# Patient Record
Sex: Female | Born: 1955 | Race: White | Hispanic: No
Health system: Southern US, Community
[De-identification: ages and names within clinical notes are randomized; demographics above are authoritative.]

---

## 2009-09-12 ENCOUNTER — Encounter: Admission: RE | Admit: 2009-09-12 | Discharge: 2009-09-12 | Payer: Self-pay | Admitting: Unknown Physician Specialty

## 2015-05-03 ENCOUNTER — Ambulatory Visit (INDEPENDENT_AMBULATORY_CARE_PROVIDER_SITE_OTHER): Payer: BLUE CROSS/BLUE SHIELD | Admitting: Sports Medicine

## 2015-05-03 ENCOUNTER — Ambulatory Visit (INDEPENDENT_AMBULATORY_CARE_PROVIDER_SITE_OTHER): Payer: BLUE CROSS/BLUE SHIELD

## 2015-05-03 VITALS — BP 153/81 | HR 82 | Resp 18 | Wt 171.0 lb

## 2015-05-03 DIAGNOSIS — M25571 Pain in right ankle and joints of right foot: Secondary | ICD-10-CM

## 2015-05-03 MED ORDER — MELOXICAM 15 MG PO TABS: ORAL_TABLET | ORAL | Status: AC

## 2015-05-03 NOTE — Progress Notes (Signed)
   Subjective:    I'm seeing this patient as a consultation for:  Dr. Harl Bowie  CC: Right ankle pain  HPI: Patient presents with 4 months of right ankle pain that began after she fell out of a golf cart. She says that the ankle initially became swollen and bruised but "not too bad" on either account. She rested it at first and the pain/swelling subsided after a few days. She reports that since walking on it again she has had pain that it is worse with going up and down stairs or with any kind of exaggerated ankle motion. She cannot point to a particular motion that brings the pain on, more so any ankle movement. She has tried some ibuprofen and an OTC ankle brace with limited relief. Pain is described as more annoying than debilitating.  The pain does not radiate and is not associated with any weakness, numbness, or tingling. She has not noticed any fevers, erythema, or swelling.  Past medical history, Surgical history, Family history not pertinant except as noted below, Social history, Allergies, and medications have been entered into the medical record, reviewed, and no changes needed.   Review of Systems: No headache, visual changes, nausea, vomiting, diarrhea, constipation, dizziness, abdominal pain, skin rash, fevers, chills, night sweats, weight loss, swollen lymph nodes, body aches, joint swelling, muscle aches, chest pain, shortness of breath, mood changes, visual or auditory hallucinations.   Objective:   General: Well Developed, well nourished, and in no acute distress.  Neuro/Psych: Alert and oriented x3, extra-ocular muscles intact, able to move all 4 extremities, sensation grossly intact. Skin: Warm and dry, no rashes noted.  Respiratory: Not using accessory muscles, speaking in full sentences, trachea midline.  Cardiovascular: Pulses palpable, no extremity edema. Abdomen: Does not appear distended. Right Ankle: Minor swelling noted just anterior and inferior to the lateral  malleolus with no visible erythema. Range of motion is full in all directions. Strength is 5/5 in all directions. Resisted eversion and dorsiflexion bring on pain. Negative anterior drawer sign with stable lateral and medial ligaments; squeeze test, flexion and inversion, and kleiger test unremarkable; Talar dome and ATFL region tender; No pain at base of 5th MT; No tenderness over cuboid; No tenderness over N spot or navicular prominence No tenderness on posterior aspects of lateral and medial malleolus No sign of peroneal tendon subluxations or tenderness to palpation Negative tarsal tunnel tinel's Able to walk 4 steps.  Impression and Recommendations:   This case required medical decision making of moderate complexity.  Location of patient's pain and mechanism of injury is suggestive of an ATFL injury. Talar dome could also be the patient's pain generator. Will Rx Meloxicam and PT. Will also get X-rays to help r/o any fracture. Patient encouraged to wear her ankle brace consistently and F/U in 1 month.

## 2015-05-03 NOTE — Assessment & Plan Note (Signed)
4 months of pain referable to the ATFL/lateral talar dome. 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 Stirrup ankle brace, meloxicam, x-rays, formal physical therapy. Return in one month

## 2015-05-14 ENCOUNTER — Ambulatory Visit (INDEPENDENT_AMBULATORY_CARE_PROVIDER_SITE_OTHER): Payer: BLUE CROSS/BLUE SHIELD | Admitting: Rehabilitative and Restorative Service Providers"

## 2015-05-14 ENCOUNTER — Encounter: Payer: Self-pay | Admitting: Rehabilitative and Restorative Service Providers"

## 2015-05-14 DIAGNOSIS — M25571 Pain in right ankle and joints of right foot: Secondary | ICD-10-CM

## 2015-05-14 DIAGNOSIS — Z7409 Other reduced mobility: Secondary | ICD-10-CM

## 2015-05-14 DIAGNOSIS — M25671 Stiffness of right ankle, not elsewhere classified: Secondary | ICD-10-CM

## 2015-05-14 NOTE — Patient Instructions (Signed)
Avoid standing with left knee locked!  Achilles / Gastroc, Standing    Stand, right foot behind, heel on floor and turned slightly out, leg straight, forward leg bent. Move hips forward. Hold _30__ seconds. Repeat _3__ times per session. Do _3-4__ sessions per day.  Achilles / Soleus, Standing    Stand, right foot behind, heel on floor and turned slightly out. Lower hips and bend knees. Hold _30__ seconds. Repeat 3_ times per session. Do _3-4__ sessions per day.   Plantar Flexion: Resisted    Anchor behind, tubing around right foot, press down. Repeat __10__ times per set. Do __1-3__ sets per session. Do __1__ sessions per day. Repeat with fast reps     Dorsiflexion: Resisted    Facing anchor, tubing around right foot, pull toward face.  Repeat ___10 times per set. Do _1-3___ sets per session. Do ___1_ sessions per day. Repeat  10 fast    Inversion: Resisted    Cross legs with right leg underneath, foot in tubing loop. Hold tubing around other foot to resist and turn foot in. Repeat __10__ times per set. Do _1-3___ sets per session. Do __1__ sessions per day.    Eversion: Resisted    With right foot in tubing loop, hold tubing around other foot to resist and turn foot out. Repeat _10___ times per set. Do _1-3___ sets per session. Do __1__ sessions per day.    Toe Curl: Bilateral    With both feet resting on towel, slowly bunch up towel by curling toes. Hold _30-60___ seconds. Repeat _1-3___ times per set.  Do __1__ sessions per day.    Heel Raise: Bilateral (Standing)    Rise on balls of feet. Repeat _10___ times per set. Do __1-3__ sets per session. Do __1_ sessions per day.    Heel Raise: Unilateral (Standing)    Balance on right foot, then rise on ball of foot. Repeat _10___ times per set. Do __1-3__ sets per session. Do _1___ sessions per day.   Balance: Unilateral    Attempt to balance on left leg, eyes open. Hold _60___  seconds. Repeat _3-5__ times per set.  Do ___1-2_ sessions per day. Perform exercise with eyes closed.    Balance: Unilateral - Foam    Eyes open, balance with right leg on dense foam. Hold _60___ seconds. Repeat __3-5__ times per set. Do _1___ sessions per day. Perform exercise with eyes closed.    Balance: Unilateral - Forward Lean    Stand on left foot, hands on hips. Keeping hips level, bend forward as if to touch forehead to wall. Hold _60___ seconds. Can do 10 reps  Relax. Repeat ___1-3_ times per set. Do __1__ sessions per day.

## 2015-05-14 NOTE — Therapy (Signed)
[facility] [place] [address] [place] [address] [place], [place], [address] Phone: [phone]   Fax:  [phone]  Physical Therapy Evaluation  Patient Details  Name: Jena Tegeler MRN: [id number] Date of Birth: 06-09-1955 Referring Provider: Benjamin Stain  Encounter Date: 05/14/2015      PT End of Session - 05/14/15 0848    Visit Number 1   Number of Visits 6   Date for PT Re-Evaluation 06/25/15   PT Start Time 0849   PT Stop Time 0954   PT Time Calculation (min) 65 min   Activity Tolerance Patient tolerated treatment well      History reviewed. No pertinent past medical history.  History reviewed. No pertinent past surgical history.  There were no vitals filed for this visit.  Visit Diagnosis:  Ankle pain, right - Plan: PT plan of care cert/re-cert  Stiffness of ankle joint, right - Plan: PT plan of care cert/re-cert  Impaired functional mobility and endurance - Plan: PT plan of care cert/re-cert      Subjective Assessment - 05/14/15 0851    Subjective Patiewnt reports that she had Rt ankle injury September 2016 when she sprained ankle. Symptoms improved but she has had continued problems. She notices problems with certain activities such as ascending/descending; dancing and spinning; sitting certain ways; standing on tip toes.    Pertinent History denies musculoskeletal problems    How long can you sit comfortably? no limit with sitting normally; diffficulty sitting on floor with feet under her    How long can you stand comfortably? notices discomfort with prolonged standing - after 1 hour    How long can you walk comfortably? 20-30 min    Diagnostic tests xrays    Patient Stated Goals fix ankle - so she can return to all normal activities    Currently in Pain? No/denies   Pain Location Ankle   Pain Orientation Right   Pain Descriptors / Indicators Aching;Burning   Pain Type Acute pain   Pain Onset More than a month ago   Pain Frequency Intermittent   Aggravating Factors  standing on tip toes; sitting on feet; prolonged standing/walking    Pain Relieving Factors stop activity             [facility] PT Assessment - 05/14/15 0001    Assessment   Medical Diagnosis Rt ankle pain    Referring Provider Thekkekandam   Onset Date/Surgical Date 12/28/14   Hand Dominance Right   Next MD Visit 2/17   Prior Therapy none   Precautions   Precautions None   Required Braces or Orthoses Other Brace/Splint   Other Brace/Splint AFO velcro elastic splint    Restrictions   Weight Bearing Restrictions No   Balance Screen   Has the patient fallen in the past 6 months No   Has the patient had a decrease in activity level because of a fear of falling?  No   Is the patient reluctant to leave their home because of a fear of falling?  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 - works for [employer] household chores; walking 20-30 min 2-3 days/wk; sgag danding 1 x/wk   Observation/Other Assessments   Focus on Therapeutic Outcomes (FOTO)  30% limitation   Sensation  Additional Comments WFL's per pt report    Posture/Postural Control   Posture Comments stands with weight shifted to Lt; Lt knee hyperextended.; knees in varus posture bilat    AROM   AROM Assessment Site --  ROM WNL's bilat hips/knees    Right Ankle Dorsiflexion 1  PROM 5 deg    Right Ankle Plantar Flexion 38   Right Ankle Inversion 47   Right Ankle Eversion 30   Left Ankle Dorsiflexion 5   Left Ankle Plantar Flexion 46   Left Ankle Inversion 36   Left Ankle Eversion 26   Strength   Overall Strength Comments 5/5 bilat LE's - painful with heel raises on Rt but could do 10 reps   Palpation   Palpation comment tightness through Rt fibularis  ant/post; gastroc/soleus; lateral cancaneous; distal and posterior to malleolus   tightness with ankle mobs Rt compared to Lt talus/calcaneus   Ambulation/Gait   Gait Comments WFL's no assymetries noted                    [facility] Adult PT Treatment/Exercise - 05/14/15 0001    Self-Care   Self-Care --  use of ice for home    Neuro Re-ed    Neuro Re-ed Details  working on posture and alignment - avoid hyperextension of knee in standing   Cryotherapy   Number Minutes Cryotherapy 12 Minutes   Cryotherapy Location Ankle   Type of Cryotherapy Ice pack   Manual Therapy   Joint Mobilization Rt talus/calcaneus; forefoot    Ankle Exercises: Stretches   Soleus Stretch 3 reps;30 seconds   Gastroc Stretch 3 reps;30 seconds   Ankle Exercises: Standing   SLS 60 sec x 3   Heel Raises 10 reps;1 second  bilat/Rt only   Heel Raises Limitations touch at counter for support    Other Standing Ankle Exercises fwd lean/touch standing on Rt LE 10 x 3    Ankle Exercises: Seated   Other Seated Ankle Exercises 4 way ankle TB with blue TB                 PT Education - 05/14/15 0948    Education provided Yes   Education Details posture; avoid standing with knees hyperextended; HEP    Person(s) Educated Patient   Methods Explanation;Demonstration;Tactile cues;Verbal cues;Handout   Comprehension Verbalized understanding;Returned demonstration;Verbal cues required;Tactile cues required             PT Long Term Goals - 05/14/15 0957    PT LONG TERM GOAL #1   Title Improve posture and alignment 06/25/15   Time 6   Period Weeks   Status New   PT LONG TERM GOAL #2   Title Improve ROM Rt ankle dorsiflexion to 7- 10 degrees 06/25/15   Time 6   Period Weeks   Status New   PT LONG TERM GOAL #3   Title 5/5 Rt ankle plantar flexion strength with no pain 06/25/15   Time 6   Period Weeks   Status New   PT LONG TERM GOAL #4   Title I HEP 06/25/15   Time 6   Period Weeks   Status New    PT LONG TERM GOAL #5   Title Improve FOTO to </=  27% limitation 06/25/15   Time 6   Period Weeks   Status New               Plan - 05/14/15 0954    Clinical Impression  Statement Patient presents with reolving Rt ankle sprain. She has limited ankle dorsiflexion Rt compared to Lt; poor posture and alignment; tenderness to palpation in Rt ankle; limited functional activity level; pain with functional activities. Pt will benefit from PT to address problems identified and return pt to normal functional activity level.    Pt will benefit from skilled therapeutic intervention in order to improve on the following deficits Postural dysfunction;Improper body mechanics;Decreased range of motion;Decreased mobility;Pain;Decreased endurance;Decreased activity tolerance   Rehab Potential Good   PT Frequency 1x / week   PT Duration 6 weeks   PT Treatment/Interventions Patient/family education;ADLs/Self Care Home Management;Neuromuscular re-education;Therapeutic exercise;Therapeutic activities;Cryotherapy;Electrical Stimulation;Moist Heat;Ultrasound;Iontophoresis /ml Dexamethasone;Manual techniques;Dry needling   PT Next Visit Plan progress with ROM and strengthening Rt ankle; mobilization Rt ankle; postural correction; possibly ionto   Consulted and Agree with Plan of Care Patient         Problem List Patient Active Problem List   Diagnosis Date Noted  . Right ankle pain 05/03/2015    Teale Goodgame Rober Minion PT, MPH  05/14/2015, 10:10 AM  [facility] [address] [place] [address] [place], [place], [address] Phone: [phone]   Fax:  [phone]  Name: Consepcion Utt MRN: [id number] Date of Birth: 1955/08/09

## 2015-05-24 ENCOUNTER — Ambulatory Visit (INDEPENDENT_AMBULATORY_CARE_PROVIDER_SITE_OTHER): Payer: BLUE CROSS/BLUE SHIELD | Admitting: Physical Therapy

## 2015-05-24 ENCOUNTER — Ambulatory Visit (INDEPENDENT_AMBULATORY_CARE_PROVIDER_SITE_OTHER): Payer: BLUE CROSS/BLUE SHIELD | Admitting: Sports Medicine

## 2015-05-24 ENCOUNTER — Encounter: Payer: Self-pay | Admitting: Physical Therapy

## 2015-05-24 VITALS — BP 163/84 | HR 81 | Resp 18 | Wt 168.4 lb

## 2015-05-24 DIAGNOSIS — M25671 Stiffness of right ankle, not elsewhere classified: Secondary | ICD-10-CM

## 2015-05-24 DIAGNOSIS — Z7409 Other reduced mobility: Secondary | ICD-10-CM

## 2015-05-24 DIAGNOSIS — M25571 Pain in right ankle and joints of right foot: Secondary | ICD-10-CM | POA: Diagnosis not present

## 2015-05-24 NOTE — Therapy (Signed)
[facility] [place] [address] [place] [address] [place], [place], [address] Phone: [phone]   Fax:  [phone]  Physical Therapy Treatment  Patient Details  Name: Ellen Ellis MRN: [id number] Date of Birth: [birth year] Referring Provider: Benjamin Stain  Encounter Date: 05/24/2015      PT End of Session - 05/24/15 0807    Visit Number 2   Number of Visits 6   Date for PT Re-Evaluation 06/25/15   PT Start Time 0804   PT Stop Time 0859   PT Time Calculation (min) 55 min      History reviewed. No pertinent past medical history.  History reviewed. No pertinent past surgical history.  There were no vitals filed for this visit.  Visit Diagnosis:  Ankle pain, right  Stiffness of ankle joint, right  Impaired functional mobility and endurance      Subjective Assessment - 05/24/15 0805    Subjective Pt reports she is doing really well with her HEP and the ankle feels pretty good today.    Currently in Pain? Yes   Pain Score 2    Pain Location Ankle   Pain Orientation Lateral   Pain Descriptors / Indicators Dull   Pain Type Acute pain   Pain Onset More than a month ago                         [facility] Adult PT Treatment/Exercise - 05/24/15 0001    Modalities   Modalities Moist Heat   Moist Heat Therapy   Number Minutes Moist Heat 12 Minutes   Moist Heat Location --  Rt lower leg.    Manual Therapy   Manual Therapy Soft tissue mobilization   Soft tissue mobilization Rt calf and fibularis   Ankle Exercises: Aerobic   Stationary Bike L2x 5'   Ankle Exercises: Standing   SLS 3x30sec on blue disc   Heel Raises 10 reps  each toes in/straight/out   Other Standing Ankle Exercises reviewed HEP ankle inversion with band.   Ankle Exercises: Stretches   Gastroc Stretch 1 rep;30 seconds  then on [employer] Dry Needling - 05/24/15 0856    Consent Given? Yes   Education Handout Provided Yes    Muscles Treated Lower Body Soleus;Gastrocnemius  fibularis longis and brevis   Gastrocnemius Response Twitch response elicited;Palpable increased muscle length   Soleus Response Twitch response elicited;Palpable increased muscle length              PT Education - 05/24/15 0825    Education provided (p) Yes   Education Details (p) TDN   Person(s) Educated (p) Patient   Methods (p) Explanation   Comprehension (p) Returned demonstration             PT Long Term Goals - 05/14/15 0957    PT LONG TERM GOAL #1   Title Improve posture and alignment 06/25/15   Time 6   Period Weeks   Status New   PT LONG TERM GOAL #2   Title Improve ROM Rt ankle dorsiflexion to 7- 10 degrees 06/25/15   Time 6   Period Weeks   Status New   PT LONG TERM GOAL #3   Title 5/5 Rt ankle plantar flexion strength with no pain 06/25/15   Time 6   Period Weeks   Status New   PT LONG TERM GOAL #4   Title I HEP 06/25/15  Time 6   Period Weeks   Status New   PT LONG TERM GOAL #5   Title Improve FOTO to </=  27% limitation 06/25/15   Time 6   Period Weeks   Status New               Plan - 05/24/15 4098    Clinical Impression Statement Pt continues with tightness in her Rt ankle that will limit motion.  She had good twitch response with needling. She sees the MD this AM for a follow up.    Pt will benefit from skilled therapeutic intervention in order to improve on the following deficits Postural dysfunction;Improper body mechanics;Decreased range of motion;Decreased mobility;Pain;Decreased endurance;Decreased activity tolerance   PT Frequency 1x / week   PT Duration 6 weeks   PT Treatment/Interventions Patient/family education;ADLs/Self Care Home Management;Neuromuscular re-education;Therapeutic exercise;Therapeutic activities;Cryotherapy;Electrical Stimulation;Moist Heat;Ultrasound;Iontophoresis /ml Dexamethasone;Manual techniques;Dry needling   PT Next Visit Plan assess response to TDN    Consulted and Agree with Plan of Care Patient        Problem List Patient Active Problem List   Diagnosis Date Noted  . Right ankle pain 05/03/2015    Roderic Scarce PT 05/24/2015, 9:05 AM  [place] [address] [place] [address] [place], [place], [address] Phone: [phone]   Fax:  [phone]  Name: Kimiah Hibner MRN: [id number] Date of Birth: 1956-01-26

## 2015-05-24 NOTE — Patient Instructions (Signed)

## 2015-05-24 NOTE — Progress Notes (Signed)
  Subjective:    CC: Return to clinic 1 month for right ankle injury  HPI: Patient reports improvement with conservative regimen of Meloxicam, PT, and bracing. She says that things are "headed in the right direction" and that she is now doing most activities without pain such as ambulation and ADLs. She does say that she noticed it a few days ago when she went dancing. She says that at its worst its a 2-3/10. She reports that PT has told her to slowly wean off of the brace and she now only wears it when doing exercise such as long walks or dancing. Patient denies any changes in the quality of the pain. She is unsure if the meloxicam helps the pain but reports taking it daily.  Past medical history, Surgical history, Family history not pertinant except as noted below, Social history, Allergies, and medications have been entered into the medical record, reviewed, and no changes needed.   Review of Systems: No fevers, chills, night sweats, weight loss, chest pain, or shortness of breath.   Objective:    General: Well Developed, well nourished, and in no acute distress.  Neuro: Alert and oriented x3, extra-ocular muscles intact, sensation grossly intact.  HEENT: Normocephalic, atraumatic, pupils equal round reactive to light, neck supple, no masses, no lymphadenopathy, thyroid nonpalpable.  Skin: Warm and dry, no rashes. Cardiac: Regular rate and rhythm, no murmurs rubs or gallops, no lower extremity edema.  Respiratory: Clear to auscultation bilaterally. Not using accessory muscles, speaking in full sentences. Right Ankle: No visible erythema or swelling. Range of motion is full in all directions. Strength is 5/5 in all directions. Stable lateral and medial ligaments; squeeze test and kleiger test unremarkable; Talar dome and ATFL is tender; No pain at base of 5th MT; No tenderness over cuboid; No tenderness over N spot or navicular prominence No tenderness on posterior aspects of lateral and  medial malleolus No sign of peroneal tendon subluxations or tenderness to palpation Negative tarsal tunnel tinel's Able to walk 4 steps.  Impression and Recommendations:    Patient is improving with conservative therapy. Will stay on this regimen at this time. If patient does not get full relief 1 month from now, will consider MRI.

## 2015-05-24 NOTE — Assessment & Plan Note (Signed)
Continues to improve, symptoms continue to be referable to the lateral talar dome/ATFL. Has not yet plateaued so we will proceed with more physical therapy, she can return to see me in an as-needed basis however if insufficient improvement in one month we will obtain an MRI.

## 2015-05-29 ENCOUNTER — Encounter: Payer: Self-pay | Admitting: Rehabilitative and Restorative Service Providers"

## 2015-05-29 ENCOUNTER — Ambulatory Visit (INDEPENDENT_AMBULATORY_CARE_PROVIDER_SITE_OTHER): Payer: BLUE CROSS/BLUE SHIELD | Admitting: Rehabilitative and Restorative Service Providers"

## 2015-05-29 DIAGNOSIS — M25571 Pain in right ankle and joints of right foot: Secondary | ICD-10-CM

## 2015-05-29 DIAGNOSIS — Z7409 Other reduced mobility: Secondary | ICD-10-CM

## 2015-05-29 DIAGNOSIS — M25671 Stiffness of right ankle, not elsewhere classified: Secondary | ICD-10-CM

## 2015-05-29 NOTE — Therapy (Signed)
[facility] [place] [address] [place], [place], [address] Phone: [phone]   Fax:  [phone]  Physical Therapy Treatment  Patient Details  Name: Ellen Ellis MRN: [id number] Date of Birth: 1955-05-28 Referring Provider: Benjamin Stain  Encounter Date: 05/29/2015      PT End of Session - 05/29/15 0847    Visit Number 3   Number of Visits 6   Date for PT Re-Evaluation 06/25/15   PT Start Time 0847   PT Stop Time 0935   PT Time Calculation (min) 48 min      History reviewed. No pertinent past medical history.  History reviewed. No pertinent past surgical history.  There were no vitals filed for this visit.  Visit Diagnosis:  Ankle pain, right  Stiffness of ankle joint, right  Impaired functional mobility and endurance      Subjective Assessment - 05/29/15 0847    Subjective Patient reports some improvement in ankle. She feels the TDN was helpful. Ankle "still doesn't feel normal" but improving  Trying to avoid staniding with knees hyperextended.    Pain Location Ankle   Pain Descriptors / Indicators Sore   Pain Onset More than a month ago   Pain Frequency Intermittent            [facility] PT Assessment - 05/29/15 0001    Assessment   Medical Diagnosis Rt ankle pain    Referring Provider Thekkekandam   Onset Date/Surgical Date 12/28/14   Hand Dominance Right   Next MD Visit 3/17   Prior Therapy none   AROM   Right Ankle Dorsiflexion 4  PROM 7 deg    Palpation   Palpation comment tightness through Rt fibularis ant/post; gastroc/soleus; lateral cancaneous; distal and posterior to malleolus   tightness with ankle mobs Rt compared to Lt talus/calcaneus                     [facility] Adult PT Treatment/Exercise - 05/29/15 0001    Modalities   Modalities Moist Heat   Moist Heat Therapy   Number Minutes Moist Heat 15 Minutes   Moist Heat Location --  Rt calf and ankle    Manual Therapy   Manual Therapy Soft tissue mobilization;Joint mobilization   Manual therapy comments Pt prone and supine    Joint Mobilization Rt talus/calcaneus; forefoot    Soft tissue mobilization Rt medial calf and fibularis   Manual Traction traction through the ankle and LE with LE in extension ankle in neutral    Ankle Exercises: Aerobic   Stationary Bike L5x 5'   Ankle Exercises: Stretches   Soleus Stretch 3 reps;30 seconds   Gastroc Stretch 1 rep;30 seconds  then on prostretch   Ankle Exercises: Standing   SLS 3x45/48/43sec on blue disc   Heel Raises 10 reps  each toes in/straight/out   Heel Raises Limitations on stair 10 x 2 reps    Ankle Exercises: Seated   Other Seated Ankle Exercises 4 way ankle TB with blue TB                      PT Long Term Goals - 05/29/15 0927    PT LONG TERM GOAL #1   Title Improve posture and alignment 06/25/15   Time 6   Period Weeks   Status On-going   PT LONG TERM GOAL #2   Title Improve ROM Rt ankle dorsiflexion to 7- 10 degrees 06/25/15   Time 6  Period Weeks   Status On-going   PT LONG TERM GOAL #3   Title 5/5 Rt ankle plantar flexion strength with no pain 06/25/15   Time 6   Period Weeks   Status On-going   PT LONG TERM GOAL #4   Title I HEP 06/25/15   Time 6   Period Weeks   Status On-going   PT LONG TERM GOAL #5   Title Improve FOTO to </=  27% limitation 06/25/15   Time 6   Period Weeks   Status On-going               Plan - 05/29/15 4098    Clinical Impression Statement Good response to TDN and PT intervention. She has improved mobility through ankle and increased soft tissue extensibility through Rt calf. There is cont tightness through the medical and lateral calf. Progressing well toward stated goals of treatment.    Pt will benefit from skilled therapeutic intervention in order to improve on the following deficits Postural dysfunction;Improper body mechanics;Decreased range of motion;Decreased  mobility;Pain;Decreased endurance;Decreased activity tolerance   Rehab Potential Good   PT Frequency 1x / week   PT Duration 6 weeks   PT Treatment/Interventions Patient/family education;ADLs/Self Care Home Management;Neuromuscular re-education;Therapeutic exercise;Therapeutic activities;Cryotherapy;Electrical Stimulation;Moist Heat;Ultrasound;Iontophoresis /ml Dexamethasone;Manual techniques;Dry needling   PT Next Visit Plan cont with stretching; TDN; manual work    [employer] and Agree with Plan of Care Patient        Problem List Patient Active Problem List   Diagnosis Date Noted  . Right ankle pain 05/03/2015    Nishan Ovens Rober Minion PT, MPH  05/29/2015, 9:32 AM  [facility] [address] [place], [place], [address] Phone: [phone]   Fax:  [phone]  Name: Ellen Ellis MRN: [id number] Date of Birth: [birth year]

## 2015-06-05 ENCOUNTER — Ambulatory Visit (INDEPENDENT_AMBULATORY_CARE_PROVIDER_SITE_OTHER): Payer: BLUE CROSS/BLUE SHIELD | Admitting: Physical Therapy

## 2015-06-05 DIAGNOSIS — M25671 Stiffness of right ankle, not elsewhere classified: Secondary | ICD-10-CM

## 2015-06-05 DIAGNOSIS — M25571 Pain in right ankle and joints of right foot: Secondary | ICD-10-CM | POA: Diagnosis not present

## 2015-06-05 DIAGNOSIS — Z7409 Other reduced mobility: Secondary | ICD-10-CM | POA: Diagnosis not present

## 2015-06-05 NOTE — Therapy (Signed)
[place], [place], [address] Phone: [phone]   Fax:  [phone]  Physical Therapy Treatment  Patient Details  Name: Melody Cirrincione MRN: [id number] Date of Birth: 1955/06/05 Referring Provider: Dr Dianah Field  Encounter Date: 06/05/2015      PT End of Session - 06/05/15 0849    Visit Number 4   Number of Visits 6   Date for PT Re-Evaluation 06/25/15   PT Start Time 0849   PT Stop Time 0942   PT Time Calculation (min) 53 min   Activity Tolerance Patient tolerated treatment well      No past medical history on file.  No past surgical history on file.  There were no vitals filed for this visit.  Visit Diagnosis:  Ankle pain, right  Stiffness of ankle joint, right  Impaired functional mobility and endurance      Subjective Assessment - 06/05/15 0849    Subjective Ankle was feeling really good since the last visit,then worked in the yard on Sunday and has had discomfort since.    Patient Stated Goals fix ankle - so she can return to all normal activities    Currently in Pain? Yes   Pain Score 3    Pain Location Ankle   Pain Orientation Right;Lateral   Pain Descriptors / Indicators Aching   Pain Type Acute pain   Pain Onset More than a month ago   Pain Frequency Intermittent   Aggravating Factors  walking on uneven surfaces in yard   Pain Relieving Factors gentle exercise.             [facility] PT Assessment - 06/05/15 0001    Assessment   Medical Diagnosis Rt ankle pain    Referring Provider Dr Dianah Field   Onset Date/Surgical Date 12/28/14   Hand Dominance Right   Next MD Visit 3/17   ROM / Strength   AROM / PROM / Strength AROM;Strength   AROM   Right Ankle Dorsiflexion 7  PROM 12   Strength   Overall Strength Comments Rt ankle improving, 4+/5 throughout                     [facility] Adult PT Treatment/Exercise - 06/05/15 0001    Modalities   Modalities Moist Heat   Moist Heat Therapy   Number Minutes Moist Heat 15 Minutes   Moist Heat Location --  Rt calf and shin   Manual Therapy   Manual Therapy Soft tissue mobilization   Soft tissue mobilization medial Rt calf and shin musculature   Ankle Exercises: Aerobic   Stationary Bike L2x5'   Ankle Exercises: Standing   BAPS Standing;Level 3  HHA PRN,  clocks mov'ts   SLS 2x10 with FWD leans.   Heel Raises 10 reps  x2 on Rt LE with PRN UE assist   Ankle Exercises: Seated   Other Seated Ankle Exercises DF with blue band focus on eccentric          Trigger Point Dry Needling - 06/05/15 0929    Consent Given? Yes   Education Handout Provided No   Muscles Treated Lower Body --  post tib  - good twitch response              PT Education - 06/05/15 0855    Education provided Yes   Education Details HEP progression   Person(s) Educated Patient   Methods Explanation;Demonstration;Handout   Comprehension Returned demonstration;Verbalized understanding  PT Long Term Goals - 06/05/15 0905    PT LONG TERM GOAL #1   Title Improve posture and alignment 06/25/15   Status On-going  requires cueing for alignment with harder exercise.    PT LONG TERM GOAL #2   Title Improve ROM Rt ankle dorsiflexion to 7- 10 degrees 06/25/15   Status Achieved   PT LONG TERM GOAL #3   Title 5/5 Rt ankle plantar flexion strength with no pain 06/25/15   Status On-going   PT LONG TERM GOAL #4   Title I HEP 06/25/15   Status On-going   PT LONG TERM GOAL #5   Title Improve FOTO to </=  27% limitation 06/25/15   Status On-going               Plan - 06/05/15 0930    Clinical Impression Statement Keyandra is progressing well to all her goals.  Her ROM continues to improve and she has met that goal.  Main focus now is on strengthening and proprioception to decrease risk of reinjury.    Pt will benefit from skilled therapeutic intervention in order to improve on the  following deficits Postural dysfunction;Improper body mechanics;Decreased range of motion;Decreased mobility;Pain;Decreased endurance;Decreased activity tolerance   Rehab Potential Good   PT Frequency 1x / week   PT Duration 6 weeks   PT Treatment/Interventions Patient/family education;ADLs/Self Care Home Management;Neuromuscular re-education;Therapeutic exercise;Therapeutic activities;Cryotherapy;Electrical Stimulation;Moist Heat;Ultrasound;Iontophoresis 49m/ml Dexamethasone;Manual techniques;Dry needling   PT Next Visit Plan proproception   Consulted and Agree with Plan of Care Patient        Problem List Patient Active Problem List   Diagnosis Date Noted  . Right ankle pain 05/03/2015    SJeral PinchPT 06/05/2015, 9:32 AM  [facility] [address]Phone: [phone]  Fax:  [phone] Name: KAndreika VandagriffMRN: [id number] of Birth: [birth year]

## 2015-06-05 NOTE — Patient Instructions (Addendum)
Heel Raise: Unilateral (Standing)   [place] [phone]    Balance on right foot, then rise on ball of foot. Repeat _10___ times per set. Do __2-3__ sets per session. Do __1__ sessions per day.  Balance: Unilateral - Forward Lean    Stand on right foot, hands on hips. Keeping hips level, bend forward as if to touch forehead to wall. Hold _1-2___ seconds. Relax. Repeat __10__ times per set. Do __2-3__ sets per session. Do __1__ sessions per day.  Balance / Reach - start this once the forward lean is easy.     Stand on right foot, Holding ____ pound weight in other hand. Bend knee, lowering body, and reach across. Hold _1-2___ seconds. Relax. Repeat _10___ times per set. Do _2-3___ sets per session. Do __1__ sessions per day. Copyright  VHI. All rights reserved.

## 2015-06-12 ENCOUNTER — Ambulatory Visit (INDEPENDENT_AMBULATORY_CARE_PROVIDER_SITE_OTHER): Payer: BLUE CROSS/BLUE SHIELD | Admitting: Physical Therapy

## 2015-06-12 DIAGNOSIS — M25571 Pain in right ankle and joints of right foot: Secondary | ICD-10-CM

## 2015-06-12 DIAGNOSIS — Z7409 Other reduced mobility: Secondary | ICD-10-CM | POA: Diagnosis not present

## 2015-06-12 DIAGNOSIS — M25671 Stiffness of right ankle, not elsewhere classified: Secondary | ICD-10-CM | POA: Diagnosis not present

## 2015-06-12 NOTE — Therapy (Signed)
[facility] [place] [address] [place], [place], [address] Phone: [phone]   Fax:  [phone]  Physical Therapy Treatment  Patient Details  Name: Ellen Ellis MRN: [id number] Date of Birth: May 10, 1955 Referring Provider: Dr Benjamin Stain  Encounter Date: 06/12/2015      PT End of Session - 06/12/15 0938    Visit Number 5   Number of Visits 6   Date for PT Re-Evaluation 06/25/15   PT Start Time 0850   PT Stop Time 0937   PT Time Calculation (min) 47 min   Activity Tolerance Patient tolerated treatment well      No past medical history on file.  No past surgical history on file.  There were no vitals filed for this visit.  Visit Diagnosis:  Ankle pain, right  Stiffness of ankle joint, right  Impaired functional mobility and endurance      Subjective Assessment - 06/12/15 0854    Subjective Pt reports that her ankle was feeling really good, but after not completing exercises and also working in yard - the pain has remained    Currently in Pain? Yes   Pain Score 3    Pain Location Ankle   Pain Orientation Right;Lateral   Pain Descriptors / Indicators Aching   Aggravating Factors  going down steps, prolonged stanidng    Pain Relieving Factors stretches             [facility] PT Assessment - 06/12/15 0001    Assessment   Medical Diagnosis Rt ankle pain    Onset Date/Surgical Date 12/28/14   Hand Dominance Right   Next MD Visit 3/17   AROM   Right Ankle Dorsiflexion 8   Right Ankle Inversion 47   Right Ankle Eversion 30           [facility] Adult PT Treatment/Exercise - 06/12/15 0001    Modalities   Modalities Iontophoresis   Iontophoresis   Type of Iontophoresis Dexamethasone   Location Rt lateral ankle   Dose 1.0 cc   Time 6 hr patch   Manual Therapy   Manual Therapy Soft tissue mobilization;Joint mobilization;Taping   Joint Mobilization Rt talus/calcaneus; forefoot    Soft tissue mobilization  Edge tool assistance to Rt lateral anterior musculature to decrease adhesions and pain.  TAPE:  Rock tape applied to Rt fibularis muscle group to decrease pain, provide support and decompression.    Ankle Exercises: Stretches   Soleus Stretch 2 reps;30 seconds  2 sets   Gastroc Stretch 2 reps;30 seconds 2 sets    Ankle Exercises: Aerobic   Stationary Bike L2 x 4 min    Ankle Exercises: Standing   Heel Raises 10 reps;1 second  with weight shift to Rt.    Other Standing Ankle Exercises Lt heel taps on 3" step x 20 reps;  Rt SLS forward leans to chair height x 10 reps, repeated on L.                      PT Long Term Goals - 06/05/15 0905    PT LONG TERM GOAL #1   Title Improve posture and alignment 06/25/15   Status On-going  requires cueing for alignment with harder exercise.    PT LONG TERM GOAL #2   Title Improve ROM Rt ankle dorsiflexion to 7- 10 degrees 06/25/15   Status Achieved   PT LONG TERM GOAL #3   Title 5/5 Rt ankle plantar flexion strength with no pain  06/25/15   Status On-going   PT LONG TERM GOAL #4   Title I HEP 06/25/15   Status On-going   PT LONG TERM GOAL #5   Title Improve FOTO to </=  27% limitation 06/25/15   Status On-going               Plan - 06/12/15 1048    Clinical Impression Statement Pt reporting slight flare up over last few days.  Pt was point tender with manual therapy to fibularis muscles on Rt leg.  Pt continues with difficulty with standing heel raise and stairs due to increased pain.   Pt demonstrated slight improvement in Rt ankle ROM.  Progressing towards remaining goals.    Pt will benefit from skilled therapeutic intervention in order to improve on the following deficits Postural dysfunction;Improper body mechanics;Decreased range of motion;Decreased mobility;Pain;Decreased endurance;Decreased activity tolerance   Rehab Potential Good   PT Frequency 1x / week   PT Duration 6 weeks   PT Treatment/Interventions  Patient/family education;ADLs/Self Care Home Management;Neuromuscular re-education;Therapeutic exercise;Therapeutic activities;Cryotherapy;Electrical Stimulation;Moist Heat;Ultrasound;Iontophoresis /ml Dexamethasone;Manual techniques;Dry needling   PT Next Visit Plan Assess response to ionto and rock tape trial.  Continue proprioception, strengthening and stretching for Rt ankle. Manual therapy as indicated.    Consulted and Agree with Plan of Care Patient        Problem List Patient Active Problem List   Diagnosis Date Noted  . Right ankle pain 05/03/2015    Mayer Camel, PTA 06/12/2015 11:22 AM  [facility] [place] [address] [place], [place], [address] Phone: [phone]   Fax:  [phone]  Name: Ellen Ellis MRN: [id number] Date of Birth: December 29, 1955

## 2015-06-19 ENCOUNTER — Ambulatory Visit (INDEPENDENT_AMBULATORY_CARE_PROVIDER_SITE_OTHER): Payer: BLUE CROSS/BLUE SHIELD | Admitting: Physical Therapy

## 2015-06-19 DIAGNOSIS — M25571 Pain in right ankle and joints of right foot: Secondary | ICD-10-CM | POA: Diagnosis not present

## 2015-06-19 DIAGNOSIS — M25671 Stiffness of right ankle, not elsewhere classified: Secondary | ICD-10-CM

## 2015-06-19 DIAGNOSIS — Z7409 Other reduced mobility: Secondary | ICD-10-CM | POA: Diagnosis not present

## 2015-06-19 NOTE — Therapy (Signed)
[place], [place], [address] Phone: [phone]   Fax:  [phone]  Physical Therapy Treatment  Patient Details  Name: Ellen Ellis MRN: [id number] Date of Birth: 07/14/1955 Referring Provider: Dr Dianah Field  Encounter Date: 06/19/2015      PT End of Session - 06/19/15 0852    Visit Number 6   Number of Visits 6   Date for PT Re-Evaluation 06/25/15   PT Start Time 0850   PT Stop Time 0941   PT Time Calculation (min) 51 min   Activity Tolerance Patient tolerated treatment well      No past medical history on file.  No past surgical history on file.  There were no vitals filed for this visit.  Visit Diagnosis:  Ankle pain, right  Stiffness of ankle joint, right  Impaired functional mobility and endurance      Subjective Assessment - 06/19/15 0850    Subjective Pt reports she noticed stairs aren't as painful.  Balance is still challenging, but she is battling a sinus infection, too. Wore ankle brace when she went dancing, helped - it was not as aggrivated. Pt believes the ionto patch helped.    Currently in Pain? Yes   Pain Score 1    Pain Location Ankle   Pain Orientation Right;Lateral   Pain Descriptors / Indicators Aching;Sharp   Aggravating Factors  prolonged standing    Pain Relieving Factors stretches             [facility] PT Assessment - 06/19/15 0001    Assessment   Medical Diagnosis Rt ankle pain    Onset Date/Surgical Date 12/28/14   Hand Dominance Right   Next MD Visit PRN   AROM   Right Ankle Dorsiflexion 10   Right Ankle Plantar Flexion 60   Right Ankle Inversion 45   Right Ankle Eversion 38         [facility] Adult PT Treatment/Exercise - 06/19/15 0001    Modalities   Modalities Iontophoresis;Cryotherapy   Cryotherapy   Number Minutes Cryotherapy 12 Minutes   Cryotherapy Location Ankle   Type of Cryotherapy Ice pack   Iontophoresis   Type of  Iontophoresis Dexamethasone   Location Rt lateral ankle   Dose 1.3 cc   Time 12 hr patch    Ankle Exercises: Aerobic   Stationary Bike L2x5'   Ankle Exercises: Stretches   Soleus Stretch 30 seconds;3 reps   Gastroc Stretch 30 seconds;10 seconds;4 reps   Ankle Exercises: Standing   BAPS --   SLS Rt SLS on Bosu x 20 sec x 3 reps (occaional UE support)   Heel Raises 10 reps;1 second  with weight shift to Rt.    Heel Raises Limitations on stair 10 x 2 reps (BLE)   Other Standing Ankle Exercises Lt heel taps on 3" step x 10 reps, repeated with 6" step x 10;  Rt SLS forward leans to chair height x 10 reps             PT Long Term Goals - 06/19/15 0909    PT LONG TERM GOAL #1   Title Improve posture and alignment 06/25/15   Time 6   Period Weeks   Status Achieved   PT LONG TERM GOAL #2   Title Improve ROM Rt ankle dorsiflexion to 7- 10 degrees 06/25/15   Time 6   Period Weeks   Status Achieved   PT LONG TERM GOAL #3   Title  5/5 Rt ankle plantar flexion strength with no pain 06/25/15   Time 6   Period Weeks   Status On-going   PT LONG TERM GOAL #4   Title I HEP 06/25/15   Time 6   Period Weeks   Status On-going   PT LONG TERM GOAL #5   Title Improve FOTO to </=  27% limitation 06/25/15   Time 6   Period Weeks   Status On-going               Plan - 06/19/15 2909    Clinical Impression Statement Pt demonstrated improved Rt ankle ROM. Pt tolerated new exercises with some mild increase in pain with PF in Rt ankle.  Heel taps less challenging, but still somewhat painful.  Pt interested in continuation of therapy to meet stated goals.  Pt has met LTG #1, good progress towards remaining goals.    Pt will benefit from skilled therapeutic intervention in order to improve on the following deficits Postural dysfunction;Improper body mechanics;Decreased range of motion;Decreased mobility;Pain;Decreased endurance;Decreased activity tolerance   Rehab Potential Good   PT Frequency  1x / week   PT Duration 6 weeks   PT Treatment/Interventions Patient/family education;ADLs/Self Care Home Management;Neuromuscular re-education;Therapeutic exercise;Therapeutic activities;Cryotherapy;Electrical Stimulation;Moist Heat;Ultrasound;Iontophoresis 15m/ml Dexamethasone;Manual techniques;Dry needling   PT Next Visit Plan Spoke to supervising PT regarding pt's progress and desire to continue therapy.  Will continue proprioception, strengthening and stretching for Rt ankle. Manual therapy as indicated.         Problem List Patient Active Problem List   Diagnosis Date Noted  . Right ankle pain 05/03/2015    JKerin Perna PTA 06/19/2015 1:31 PM  [facility] [place] [address]Phone: [phone]  Fax:  [phone] Name: Ellen GotschMRN: [id number] of Birth: [birth year]

## 2015-06-20 NOTE — Addendum Note (Signed)
Addended by: Val RilesHOLT, CELYN P on: 06/20/2015 10:02 AM   Modules accepted: Orders

## 2015-06-26 ENCOUNTER — Ambulatory Visit (INDEPENDENT_AMBULATORY_CARE_PROVIDER_SITE_OTHER): Payer: BLUE CROSS/BLUE SHIELD | Admitting: Physical Therapy

## 2015-06-26 DIAGNOSIS — M25571 Pain in right ankle and joints of right foot: Secondary | ICD-10-CM

## 2015-06-26 DIAGNOSIS — M25671 Stiffness of right ankle, not elsewhere classified: Secondary | ICD-10-CM | POA: Diagnosis not present

## 2015-06-26 DIAGNOSIS — Z7409 Other reduced mobility: Secondary | ICD-10-CM | POA: Diagnosis not present

## 2015-06-26 NOTE — Patient Instructions (Signed)
Balance: Three-Way Leg Swing    Stand on left foot, hands on hips. Reach other foot forward ___5_ times, sideways __5__ times, back __5__ times. Hold each position __1__ seconds. Relax. Repeat _2___ times per set.  [email or web address]://[email or web address].[email or web address]    Hip Flexion    Standing on one leg, bring other leg up so thigh is parallel to floor. Hold _1-2___ seconds. Repeat on other leg. Do __10__ repetitions, __2__ sets.  http://bt.[email or web address].us/40   ** Can do bent knee -heel raises.  With straight knees lift heels, then bend knees and slowly lower heels down.  Straighten knees.   [facility] [address] [place], [place] [address]  [phone] (office) [phone])

## 2015-06-26 NOTE — Therapy (Signed)
[facility] [place] [address] [place], [place], [address] Phone: [phone]   Fax:  [phone]  Physical Therapy Treatment  Patient Details  Name: Ellen Ellis MRN: [id number] Date of Birth: June 12, 1955 Referring Provider: Dr Benjamin Stain  Encounter Date: 06/26/2015      PT End of Session - 06/26/15 0846    Visit Number 7   Number of Visits 12   Date for PT Re-Evaluation 07/31/15   PT Start Time 0845   PT Stop Time 0939   PT Time Calculation (min) 54 min   Activity Tolerance Patient tolerated treatment well      No past medical history on file.  No past surgical history on file.  There were no vitals filed for this visit.  Visit Diagnosis:  Ankle pain, right  Stiffness of ankle joint, right  Impaired functional mobility and endurance      Subjective Assessment - 06/26/15 0847    Subjective Pt reports she has worn her brace on her ankle when dancing and it has helped.  She can tell it is not completely better, but it is improving.  Prolonged standing at party over weekend aggrivated it.   HEP exercises getting easier.    Currently in Pain? Yes   Pain Score 1    Pain Location Ankle   Pain Orientation Right;Lateral   Pain Descriptors / Indicators Aching   Aggravating Factors  prolonged standing    Pain Relieving Factors stretches             [facility] PT Assessment - 06/26/15 0001    Assessment   Medical Diagnosis Rt ankle pain    Onset Date/Surgical Date 12/28/14   Hand Dominance Right   Next MD Visit PRN           [facility] Adult PT Treatment/Exercise - 06/26/15 0001    Modalities   Modalities Cryotherapy;Iontophoresis   Cryotherapy   Number Minutes Cryotherapy 12 Minutes   Cryotherapy Location Ankle   Type of Cryotherapy Ice pack   Iontophoresis   Type of Iontophoresis Dexamethasone   Location Rt lateral ankle   Dose 1.3 cc   Time 12 hr patch    Ankle Exercises: Aerobic   Stationary Bike  NuStep L5: 6 min    Ankle Exercises: Stretches   Soleus Stretch 30 seconds;3 reps   Gastroc Stretch 30 seconds;10 seconds;4 reps   Ankle Exercises: Standing   BAPS Standing;Level 3;10 reps  front/back, side/side, CW/CCW    SLS Rt SLS on upside down Bosu x 20 sec x 3 reps (occaional UE support)   Heel Raises 10 reps;1 second  then RLE x 20 (reduced heel height of 2")   Other Standing Ankle Exercises Rt SLS with Lt toe taps to front / side/ back - forward lean to chair height to hip and knee flexion x 5 reps; repeated with LLE.            PT Long Term Goals - 06/19/15 0909    PT LONG TERM GOAL #1   Title Improve posture and alignment 06/25/15   Time 6   Period Weeks   Status Achieved   PT LONG TERM GOAL #2   Title Improve ROM Rt ankle dorsiflexion to 7- 10 degrees 06/25/15   Time 6   Period Weeks   Status Achieved   PT LONG TERM GOAL #3   Title 5/5 Rt ankle plantar flexion strength with no pain 07/31/15   Time 12   Period Weeks  Status Revised   PT LONG TERM GOAL #4   Title I HEP 07/31/15   Time 12   Period Weeks   Status Revised   PT LONG TERM GOAL #5   Title Improve FOTO to </=  27% limitation 07/31/15   Time 12   Period Weeks   Status Revised   PT LONG TERM GOAL #6   Title Patient reports return to functional activities(yard work, household chores, dancing) with minimal to no pain following activity 07/31/15   Time 12   Period Weeks   Status New               Plan - 06/26/15 0901    Clinical Impression Statement Pt able to complete 20 Rt heel raises (with mild increase in pain), but heel height was reduced height by 2" compared to Lt heel raise. Pt tolerated new exercises with minimal increase in pain in Rt ankle.     Pt will benefit from skilled therapeutic intervention in order to improve on the following deficits Postural dysfunction;Improper body mechanics;Decreased range of motion;Decreased mobility;Pain;Decreased endurance;Decreased activity tolerance    Rehab Potential Good   PT Frequency 1x / week   PT Duration 6 weeks   PT Treatment/Interventions Patient/family education;ADLs/Self Care Home Management;Neuromuscular re-education;Therapeutic exercise;Therapeutic activities;Cryotherapy;Electrical Stimulation;Moist Heat;Ultrasound;Iontophoresis 4mg /ml Dexamethasone;Manual techniques;Dry needling   PT Next Visit Plan continue proprioception, strengthening and stretching for Rt ankle. Manual therapy as indicated.    Consulted and Agree with Plan of Care Patient        Problem List Patient Active Problem List   Diagnosis Date Noted  . Right ankle pain 05/03/2015    Salvadore OxfordCarlson-Long, Jennifer L 06/26/2015, 1:25 PM  [facility] [address] [place], [place], [address] Phone: [phone]   Fax:  [phone]  Name: Jonna CoupKathy Sauve MRN: [id number] Date of Birth: November 10, 1955

## 2015-07-03 ENCOUNTER — Ambulatory Visit (INDEPENDENT_AMBULATORY_CARE_PROVIDER_SITE_OTHER): Payer: BLUE CROSS/BLUE SHIELD | Admitting: Physical Therapy

## 2015-07-03 DIAGNOSIS — Z7409 Other reduced mobility: Secondary | ICD-10-CM | POA: Diagnosis not present

## 2015-07-03 DIAGNOSIS — M25671 Stiffness of right ankle, not elsewhere classified: Secondary | ICD-10-CM | POA: Diagnosis not present

## 2015-07-03 DIAGNOSIS — M25571 Pain in right ankle and joints of right foot: Secondary | ICD-10-CM | POA: Diagnosis not present

## 2015-07-03 NOTE — Therapy (Signed)
[facility] [address] [place], [place], [address] Phone: [phone]   Fax:  [phone]  Physical Therapy Treatment  Patient Details  Name: Ellen CoupKathy Ellis MRN: [id number] Date of Birth: October 02, 1955 Referring Provider: Dr Benjamin Stainhekkekandam  Encounter Date: 07/03/2015      PT End of Session - 07/03/15 0855    Visit Number 8   Number of Visits 12   Date for PT Re-Evaluation 07/31/15   PT Start Time 0850   PT Stop Time 0946   PT Time Calculation (min) 56 min   Activity Tolerance Patient tolerated treatment well      No past medical history on file.  No past surgical history on file.  There were no vitals filed for this visit.  Visit Diagnosis:  Ankle pain, right  Stiffness of ankle joint, right  Impaired functional mobility and endurance      Subjective Assessment - 07/03/15 0854    Subjective Pt reports she stood all day Saturday which irritated her ankle.  "It's not quite right yet".    Currently in Pain? Yes   Pain Score 2    Pain Location Ankle   Pain Orientation Right;Lateral   Pain Descriptors / Indicators Aching;Burning   Aggravating Factors  prolonged standing    Pain Relieving Factors stretches             [facility] PT Assessment - 07/03/15 0001    Assessment   Medical Diagnosis Rt ankle pain    Onset Date/Surgical Date 12/28/14   Hand Dominance Right   Next MD Visit PRN   AROM   Right Ankle Dorsiflexion 13          [facility] Adult PT Treatment/Exercise - 07/03/15 0001    Cryotherapy   Number Minutes Cryotherapy 10 Minutes   Cryotherapy Location Ankle   Type of Cryotherapy Ice pack   Manual Therapy   Manual Therapy Soft tissue mobilization;Taping   Manual therapy comments Rock tape applied to Rt lateral fibularis muscle group to decompress tissue and decrease pain.    Soft tissue mobilization Edge tool assistance to Rt lateral anterior musculature to decrease adhesions and pain.    Ankle  Exercises: Standing   BAPS Standing;Level 3;10 reps  front/back, side/side, CW/CCW    Heel Raises 10 reps  3 sets - toes straight, in, out   Other Standing Ankle Exercises Lt heel taps on 6" step x 10 reps.    Other Standing Ankle Exercises Rt /Lt SLS on blue pad with forward leans x 10    Ankle Exercises: Aerobic   Stationary Bike L2x5'   Ankle Exercises: Stretches   Soleus Stretch 30 seconds;3 reps   Gastroc Stretch 30 seconds;10 seconds;4 reps           PT Long Term Goals - 06/19/15 0909    PT LONG TERM GOAL #1   Title Improve posture and alignment 06/25/15   Time 6   Period Weeks   Status Achieved   PT LONG TERM GOAL #2   Title Improve ROM Rt ankle dorsiflexion to 7- 10 degrees 06/25/15   Time 6   Period Weeks   Status Achieved   PT LONG TERM GOAL #3   Title 5/5 Rt ankle plantar flexion strength with no pain 07/31/15   Time 12   Period Weeks   Status Revised   PT LONG TERM GOAL #4   Title I HEP 07/31/15   Time 12   Period Weeks   Status  Revised   PT LONG TERM GOAL #5   Title Improve FOTO to </=  27% limitation 07/31/15   Time 12   Period Weeks   Status Revised   PT LONG TERM GOAL #6   Title Patient reports return to functional activities(yard work, household chores, dancing) with minimal to no pain following activity 07/31/15   Time 12   Period Weeks   Status New               Plan - 07/03/15 4098    Clinical Impression Statement Pt demonstrated improved Rt ankle DF.  Pt tolerated all exercises with greater ease than last session and minimal increase in pain. Pt point tender along lateral ankle / fibularis muscle group with manual.  Progressing towards goals.    Pt will benefit from skilled therapeutic intervention in order to improve on the following deficits Postural dysfunction;Improper body mechanics;Decreased range of motion;Decreased mobility;Pain;Decreased endurance;Decreased activity tolerance   Rehab Potential Good   PT Frequency 1x / week   PT  Duration 6 weeks   PT Treatment/Interventions Patient/family education;ADLs/Self Care Home Management;Neuromuscular re-education;Therapeutic exercise;Therapeutic activities;Cryotherapy;Electrical Stimulation;Moist Heat;Ultrasound;Iontophoresis /ml Dexamethasone;Manual techniques;Dry needling   PT Next Visit Plan continue proprioception, strengthening and stretching for Rt ankle. Manual therapy as indicated.    Consulted and Agree with Plan of Care Patient        Problem List Patient Active Problem List   Diagnosis Date Noted  . Right ankle pain 05/03/2015   Mayer Camel, PTA 07/03/2015 1:08 PM  [facility] [place] [address] [place], [place], [address] Phone: [phone]   Fax:  [phone]  Name: Ellen Ellis MRN: [id number] Date of Birth: October 28, 1955

## 2015-07-11 ENCOUNTER — Ambulatory Visit (INDEPENDENT_AMBULATORY_CARE_PROVIDER_SITE_OTHER): Payer: BLUE CROSS/BLUE SHIELD | Admitting: Physical Therapy

## 2015-07-11 DIAGNOSIS — M25571 Pain in right ankle and joints of right foot: Secondary | ICD-10-CM

## 2015-07-11 DIAGNOSIS — Z7409 Other reduced mobility: Secondary | ICD-10-CM | POA: Diagnosis not present

## 2015-07-11 DIAGNOSIS — M25671 Stiffness of right ankle, not elsewhere classified: Secondary | ICD-10-CM

## 2015-07-11 NOTE — Therapy (Signed)
[place], [place], [address] Phone: [phone]   Fax:  [phone]  Physical Therapy Treatment  Patient Details  Name: Ellen Ellis MRN: [id number] Date of Birth: 10/07/1955 Referring Provider: Dr Dianah Field  Encounter Date: 07/11/2015      PT End of Session - 07/11/15 0930    Visit Number 9   Number of Visits 12   Date for PT Re-Evaluation 07/31/15   PT Start Time 0850   PT Stop Time 0931   PT Time Calculation (min) 41 min   Activity Tolerance Patient tolerated treatment well;No increased pain      No past medical history on file.  No past surgical history on file.  There were no vitals filed for this visit.  Visit Diagnosis:  Ankle pain, right  Stiffness of ankle joint, right  Impaired functional mobility and endurance      Subjective Assessment - 07/11/15 0904    Subjective Pt reports her Rt ankle has felt "pretty good" for last 2 days, but she has been preoccupied with episode of hives over last 2 days (unknown origin)   Currently in Pain? No/denies            [facility] PT Assessment - 07/11/15 0001    Assessment   Medical Diagnosis Rt ankle pain    Onset Date/Surgical Date 12/28/14   Hand Dominance Right   Next MD Visit PRN   ROM / Strength   AROM / PROM / Strength Strength   AROM   Right Ankle Dorsiflexion 13   Right Ankle Plantar Flexion --  WNL   Right Ankle Inversion --  WNL   Right Ankle Eversion --  WNL   Strength   Strength Assessment Site Ankle   Right/Left Ankle Right   Right Ankle Dorsiflexion 5/5   Right Ankle Plantar Flexion 3/5  Less than 10 heel raises (RLE only)   Right Ankle Inversion 5/5   Right Ankle Eversion --  5-/5          [facility] Adult PT Treatment/Exercise - 07/11/15 0001    Manual Therapy   Manual Therapy Soft tissue mobilization;Taping   Manual therapy comments Rock tape applied to Rt lateral fibularis muscle group to  decompress tissue and decrease pain.    Soft tissue mobilization Edge tool assistance to Rt lateral anterior musculature to decrease adhesions and pain.    Ankle Exercises: Standing   Heel Raises 10 reps  2 sets of 5 RLE only   Other Standing Ankle Exercises Lt heel taps x 10 on 3" step, repeated with 6" step (improved)    Other Standing Ankle Exercises Rt SLS on Bosu x 30 sec x 2 trials; repeated on upside down bosu.  PF/DF and inv/eversion on upside down bosu with light UE support x 10 each direction with slow controlled motion.    Ankle Exercises: Aerobic   Stationary Bike L2x5'   Ankle Exercises: Stretches   Soleus Stretch 30 seconds;3 reps   Gastroc Stretch 30 seconds;10 seconds;4 reps           PT Long Term Goals - 07/11/15 0901    PT LONG TERM GOAL #1   Title Improve posture and alignment 06/25/15   Time 6   Period Weeks   Status Achieved   PT LONG TERM GOAL #2   Title Improve ROM Rt ankle dorsiflexion to 7- 10 degrees 06/25/15   Time 6   Period Weeks   Status Achieved  PT LONG TERM GOAL #3   Title 5/5 Rt ankle plantar flexion strength with no pain 07/31/15   Time 12   Period Weeks   Status On-going   PT LONG TERM GOAL #4   Title I HEP 07/31/15   Time 12   Period Weeks   Status On-going   PT LONG TERM GOAL #5   Title Improve FOTO to </=  27% limitation 07/31/15   Time 12   Period Weeks   Status On-going   PT LONG TERM GOAL #6   Title Patient reports return to functional activities(yard work, household chores, dancing) with minimal to no pain following activity 07/31/15   Time 12   Period Weeks   Status Partially Met               Plan - 07/11/15 1232    Clinical Impression Statement Pt able to perform 5 single leg heel raises on Rt with same height to left, although begins to have increased pain after 5 reps.  Continues with mild tenderness along lateral (Rt ankle) and anterior tib with palpation and manual therapy.  Pt feels close to ready for d/c, but  requests one more visit to solidify HEP and ensure no further flair up.  Pt has partially met LTG # 6.    Pt will benefit from skilled therapeutic intervention in order to improve on the following deficits Postural dysfunction;Improper body mechanics;Decreased range of motion;Decreased mobility;Pain;Decreased endurance;Decreased activity tolerance   Rehab Potential Good   PT Frequency 1x / week   PT Duration 6 weeks   PT Treatment/Interventions Patient/family education;ADLs/Self Care Home Management;Neuromuscular re-education;Therapeutic exercise;Therapeutic activities;Cryotherapy;Electrical Stimulation;Moist Heat;Ultrasound;Iontophoresis 31m/ml Dexamethasone;Manual techniques;Dry needling   PT Next Visit Plan continue proprioception, strengthening and stretching for Rt ankle. Manual therapy as indicated. Assess readiness for d/c to HEP.    Consulted and Agree with Plan of Care Patient        Problem List Patient Active Problem List   Diagnosis Date Noted  . Right ankle pain 05/03/2015   JKerin Perna PTA 07/11/2015 12:48 PM  [facility] [place] [address]Phone: [phone]  Fax:  [phone] Name: KCharniece VenturinoMRN: [id number] of Birth: [birth year]

## 2015-07-19 ENCOUNTER — Ambulatory Visit (INDEPENDENT_AMBULATORY_CARE_PROVIDER_SITE_OTHER): Payer: BLUE CROSS/BLUE SHIELD | Admitting: Physical Therapy

## 2015-07-19 DIAGNOSIS — M6281 Muscle weakness (generalized): Secondary | ICD-10-CM | POA: Diagnosis not present

## 2015-07-19 DIAGNOSIS — M25671 Stiffness of right ankle, not elsewhere classified: Secondary | ICD-10-CM

## 2015-07-19 DIAGNOSIS — M25571 Pain in right ankle and joints of right foot: Secondary | ICD-10-CM | POA: Diagnosis not present

## 2015-07-19 NOTE — Therapy (Addendum)
[place], [place], [address] Phone: [phone]   Fax:  [phone]  Physical Therapy Treatment  Patient Details  Name: Ellen Ellis MRN: [id number] Date of Birth: 1955-04-28 Referring Provider: Dr Dianah Field  Encounter Date: 07/19/2015      PT End of Session - 07/19/15 1715    Visit Number 10   Number of Visits 12   Date for PT Re-Evaluation 07/31/15   PT Start Time 4818   PT Stop Time 1739   PT Time Calculation (min) 35 min      No past medical history on file.  No past surgical history on file.  There were no vitals filed for this visit.  Visit Diagnosis:  Ankle pain, right  Stiffness of ankle joint, right  Muscle weakness (generalized)      Subjective Assessment - 07/19/15 1718    Subjective Pt reports her Rt ankle has continued to feel good.  She is a little sore today; attributes it to wearing heels on her trip.  Pt voices she is ready to d/c to HEP at end of session.    Currently in Pain? Yes   Pain Score --  0.5   Pain Location Ankle   Pain Orientation Right;Lateral   Aggravating Factors  prolonged standing / uneven ground   Pain Relieving Factors stretches             [facility] PT Assessment - 07/19/15 0001    Assessment   Medical Diagnosis Rt ankle pain    Onset Date/Surgical Date 12/28/14   Hand Dominance Right   Next MD Visit PRN   Observation/Other Assessments   Focus on Therapeutic Outcomes (FOTO)  1% limited   AROM   Right Ankle Dorsiflexion 16   Right Ankle Plantar Flexion --  WNL   Right Ankle Inversion --  WNL   Right Ankle Eversion --  WNL   Strength   Strength Assessment Site Ankle   Right/Left Ankle Right   Right Ankle Dorsiflexion 5/5   Right Ankle Plantar Flexion 4/5  15 heel raises   Right Ankle Inversion 5/5   Right Ankle Eversion 5/5          [facility] Adult PT Treatment/Exercise - 07/19/15 0001    Modalities   Modalities --  pt  declined   Manual Therapy   Manual Therapy Soft tissue mobilization;Taping   Manual therapy comments Rock tape applied to Rt lateral fibularis muscle group to decompress tissue and decrease pain.    Soft tissue mobilization Edge tool assistance to Rt lateral anterior musculature to decrease adhesions and pain.    Ankle Exercises: Standing   Heel Raises 15 reps  RLE only.    Other Standing Ankle Exercises Lt heel taps x 10 on  6" step (improved)    Other Standing Ankle Exercises Rt SLS on grey pad x 30 sec x 3 trials (occasional UE support)   Ankle Exercises: Aerobic   Stationary Bike L2: 4 min    Elliptical L2: 2 min    Ankle Exercises: Stretches   Soleus Stretch 30 seconds;3 reps   Gastroc Stretch 30 seconds;10 seconds;4 reps    Education: verbally reviewed HEP to address any concerns or questions pt may have. Pt verbalized understanding.        PT Long Term Goals - 07/19/15 1749    PT LONG TERM GOAL #1   Title Improve posture and alignment 06/25/15   Time 6   Period Weeks  Status Achieved   PT LONG TERM GOAL #2   Title Improve ROM Rt ankle dorsiflexion to 7- 10 degrees 06/25/15   Time 6   Period Weeks   Status Achieved   PT LONG TERM GOAL #3   Title 5/5 Rt ankle plantar flexion strength with no pain 07/31/15   Time 12   Period Weeks   Status Partially Met   PT LONG TERM GOAL #4   Title I HEP 07/31/15   Time 12   Period Weeks   Status Achieved   PT LONG TERM GOAL #5   Title Improve FOTO to </=  27% limitation 07/31/15   Time 12   Period Weeks   Status Achieved   PT LONG TERM GOAL #6   Title Patient reports return to functional activities(yard work, household chores, dancing) with minimal to no pain following activity 07/31/15   Time 12   Period Weeks   Status Achieved               Plan - 07/19/15 1750    Clinical Impression Statement Pt tolerated all exercises without increase in pain.  Pt has partially met her goals but reports she feels ready to d/c to  HEP and is satisfied with current level of function.     Pt will benefit from skilled therapeutic intervention in order to improve on the following deficits Postural dysfunction;Improper body mechanics;Decreased range of motion;Decreased mobility;Pain;Decreased endurance;Decreased activity tolerance   Rehab Potential Good   PT Frequency 1x / week   PT Duration 6 weeks   PT Treatment/Interventions Patient/family education;ADLs/Self Care Home Management;Neuromuscular re-education;Therapeutic exercise;Therapeutic activities;Cryotherapy;Electrical Stimulation;Moist Heat;Ultrasound;Iontophoresis 80m/ml Dexamethasone;Manual techniques;Dry needling   PT Next Visit Plan Spoke to supervising PT; will d/c to HEP.    Consulted and Agree with Plan of Care Patient        Problem List Patient Active Problem List   Diagnosis Date Noted  . Right ankle pain 05/03/2015   JKerin Perna PTA 07/19/2015 5:51 PM  [facility] [address] [place] [address]Phone: [phone]  Fax:  [phone] Name: KJaniene AaronsMRN: [id number] of Birth: 603/17/1957   PHYSICAL THERAPY DISCHARGE SUMMARY  Visits from Start of Care: 10  Current functional level related to goals / functional outcomes: Excellent progress with ROM/strength/functional activities   Remaining deficits: Occasional stiffness or discomfort   Education / Equipment: HEP Plan: Patient agrees to discharge.  Patient goals were partially met. Patient is being discharged due to being pleased with the current functional level.  ?????    Celyn P. HHelene KelpPT, MPH 07/20/2015 10:23 AM

## 2016-11-06 IMAGING — CR DG ANKLE COMPLETE 3+V*R*
3 series · 3 of 3 positions shown · non-contrast
Comparison: None

CLINICAL DATA: Pain for 4 months at lateral talar dome post injury

EXAM:
RIGHT ANKLE - COMPLETE 3+ VIEW

[ankle ap]
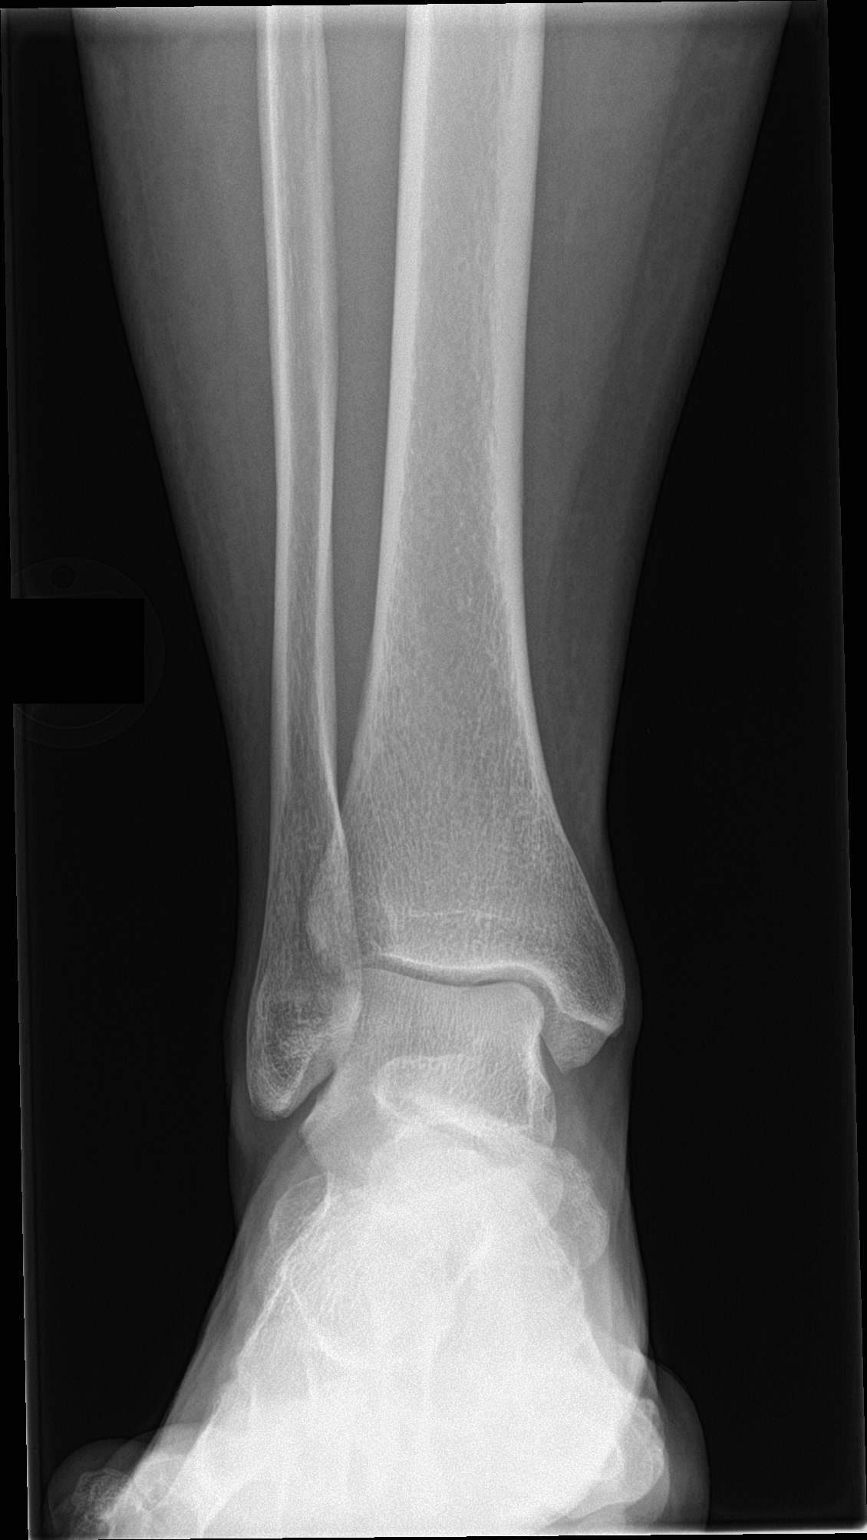

[ankle obl]
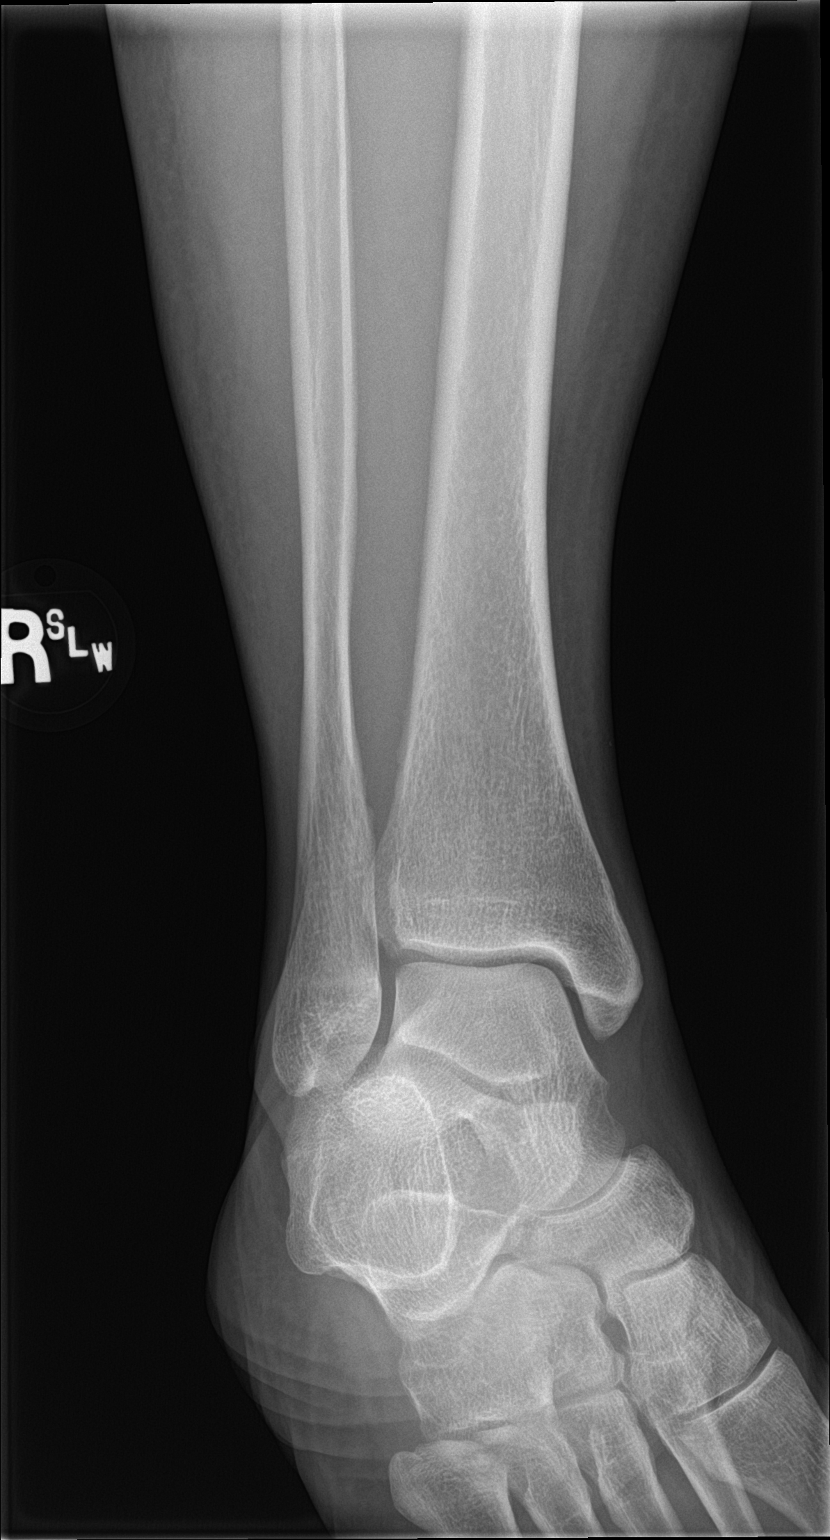

[ankle lat]
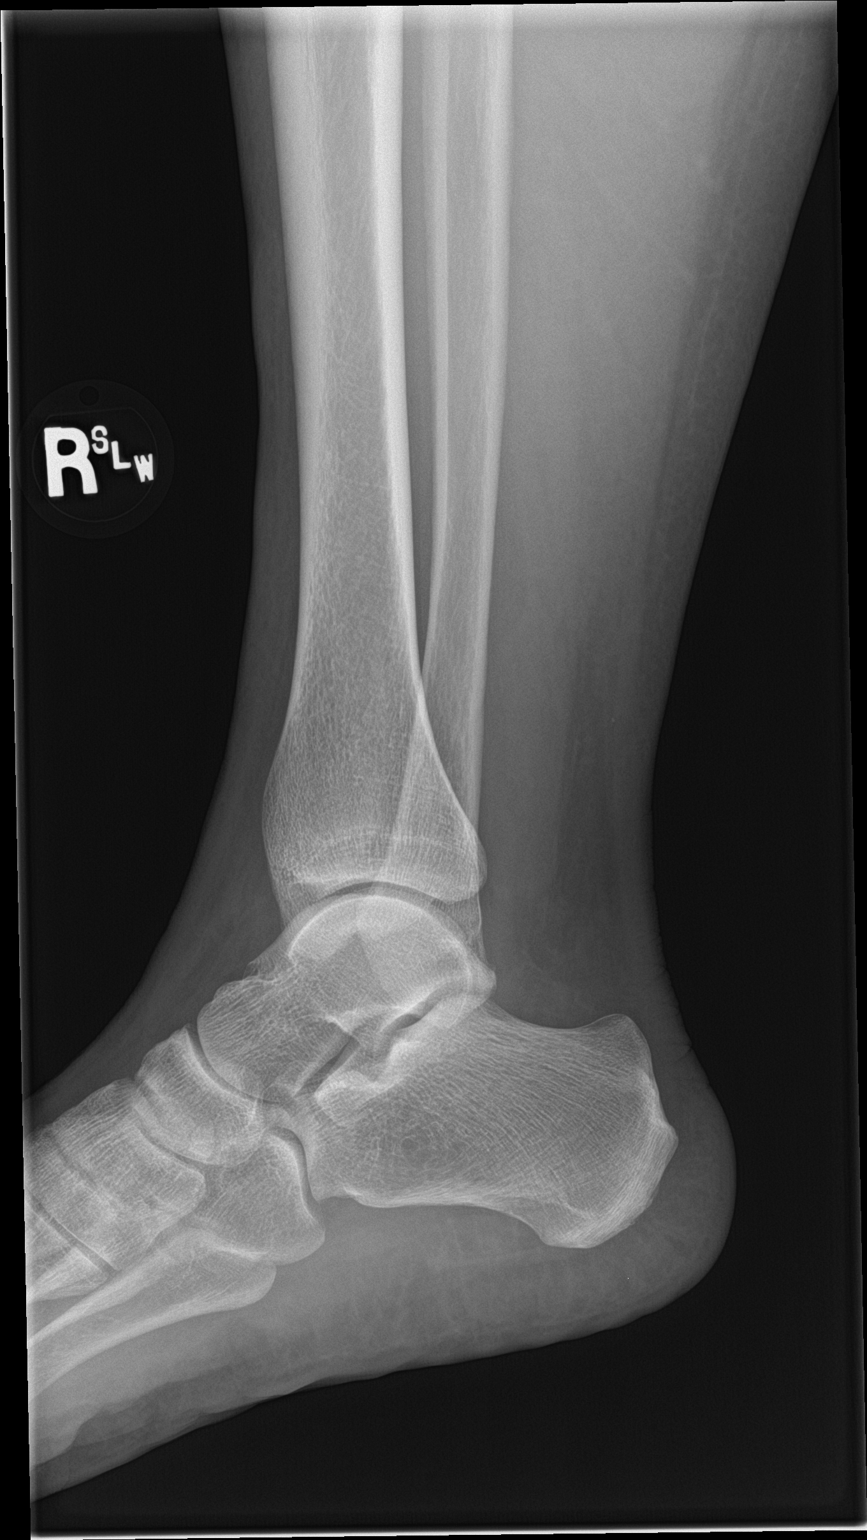

[3 of 3 positions shown; findings below may reference images not displayed]

FINDINGS: Osseous mineralization normal.

Joint spaces preserved.

No fracture, dislocation, or bone destruction.
IMPRESSION: Normal exam.
